# Patient Record
Sex: Male | Born: 2000
Health system: Southern US, Community
[De-identification: ages and names within clinical notes are randomized; demographics above are authoritative.]

---
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
## 2020-03-25 ENCOUNTER — Other Ambulatory Visit: Payer: Self-pay

## 2020-03-25 ENCOUNTER — Ambulatory Visit (HOSPITAL_COMMUNITY)
Admission: EM | Admit: 2020-03-25 | Discharge: 2020-03-25 | Disposition: A | Discharge status: Home / Self Care | Payer: Medicaid Other | Attending: Family Medicine | Admitting: Family Medicine

## 2020-03-25 ENCOUNTER — Encounter (HOSPITAL_COMMUNITY): Payer: Self-pay | Admitting: Family Medicine

## 2020-03-25 DIAGNOSIS — S50852A Superficial foreign body of left forearm, initial encounter: Secondary | ICD-10-CM

## 2020-03-25 MED ORDER — CEPHALEXIN 500 MG PO CAPS: 500.0000 mg | ORAL_CAPSULE | Freq: Two times a day (BID) | ORAL | 0 refills | Status: DC

## 2020-03-25 MED ORDER — LIDOCAINE-EPINEPHRINE (PF) 2 %-1:200000 IJ SOLN
INTRAMUSCULAR | Status: AC
  Filled 2020-03-25: qty 20
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
## 2020-03-25 NOTE — Discharge Instructions (Addendum)
Return in 1 week to have suture removed  Wash with soap and water daily starting tomorrow morning.
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
## 2020-04-06 ENCOUNTER — Other Ambulatory Visit: Payer: Self-pay

## 2020-04-06 ENCOUNTER — Ambulatory Visit (HOSPITAL_COMMUNITY)
Admission: EM | Admit: 2020-04-06 | Discharge: 2020-04-06 | Disposition: A | Discharge status: Home / Self Care | Payer: Medicaid Other

## 2020-04-06 DIAGNOSIS — Z4802 Encounter for removal of sutures: Secondary | ICD-10-CM | POA: Diagnosis not present

## 2020-05-27 ENCOUNTER — Encounter (HOSPITAL_COMMUNITY): Payer: Self-pay

## 2020-05-27 ENCOUNTER — Ambulatory Visit (HOSPITAL_COMMUNITY)
Admission: EM | Admit: 2020-05-27 | Discharge: 2020-05-27 | Disposition: A | Discharge status: Home / Self Care | Payer: Medicaid Other | Attending: Internal Medicine | Admitting: Internal Medicine

## 2020-05-27 ENCOUNTER — Other Ambulatory Visit: Payer: Self-pay

## 2020-05-27 DIAGNOSIS — J028 Acute pharyngitis due to other specified organisms: Secondary | ICD-10-CM | POA: Insufficient documentation

## 2020-05-27 DIAGNOSIS — J029 Acute pharyngitis, unspecified: Secondary | ICD-10-CM | POA: Diagnosis not present

## 2020-05-27 DIAGNOSIS — Z20822 Contact with and (suspected) exposure to covid-19: Secondary | ICD-10-CM | POA: Insufficient documentation

## 2020-05-27 DIAGNOSIS — B9789 Other viral agents as the cause of diseases classified elsewhere: Secondary | ICD-10-CM | POA: Diagnosis not present

## 2020-05-27 LAB — SARS CORONAVIRUS 2 (TAT 6-24 HRS): SARS Coronavirus 2: NEGATIVE

## 2020-05-27 NOTE — ED Provider Notes (Signed)
[place]    CSN: [id number] Arrival date & time: 05/27/20  1041      History   Chief Complaint Chief Complaint  Patient presents with  . Sore Throat  . Cough    HPI John Brown is a 19 y.o. male comes to the urgent care with a 3-day history of sore throat, runny nose and a cough productive of yellow sputum.  Patient denies any fever or chills.  No nausea or vomiting.  No sick contacts.  No loss of taste or smell.  No generalized body aches, nausea, vomiting or diarrhea.   HPI  History reviewed. No pertinent past medical history.  Patient Active Problem List   Diagnosis Date Noted  . Lumbar strain, initial encounter 07/28/2019  . Possible exposure to STD 06/25/2019  . Sore throat 06/25/2019  . Allergic contact dermatitis due to metals 07/26/2018  . Eczema 03/19/2012    History reviewed. No pertinent surgical history.     Home Medications    Prior to Admission medications   Not on File    Family History Family History  Problem Relation Age of Onset  . Healthy Mother   . Sickle cell anemia Father     Social History Social History   Tobacco Use  . Smoking status: Never Smoker  . Smokeless tobacco: Never Used  Substance Use Topics  . Alcohol use: No  . Drug use: Not Currently    Types: Marijuana     Allergies   Patient has no known allergies.   Review of Systems Review of Systems  Constitutional: Negative for activity change, chills, fatigue and fever.  Respiratory: Negative for cough, shortness of breath and wheezing.   Cardiovascular: Negative for chest pain.  Gastrointestinal: Negative.   Musculoskeletal: Negative.      Physical Exam Triage Vital Signs ED Triage Vitals [05/27/20 1104]  Enc Vitals Group     BP 128/79     Pulse Rate 67     Resp 19     Temp 98.5 F (36.9 C)     Temp Source Oral     SpO2 100 %     Weight      Height      Head Circumference      Peak Flow      Pain Score 0     Pain Loc      Pain  Edu?      Excl. in [place]?    No data found.  Updated Vital Signs BP 128/79 (BP Location: Left Arm)   Pulse 67   Temp 98.5 F (36.9 C) (Oral)   Resp 19   SpO2 100%   Visual Acuity Right Eye Distance:   Left Eye Distance:   Bilateral Distance:    Right Eye Near:   Left Eye Near:    Bilateral Near:     Physical Exam Vitals and nursing note reviewed.  Constitutional:      General: He is not in acute distress.    Appearance: He is not ill-appearing.  HENT:     Mouth/Throat:     Tonsils: No tonsillar exudate or tonsillar abscesses. 0 on the right. 0 on the left.  Cardiovascular:     Rate and Rhythm: Normal rate and regular rhythm.     Heart sounds: Normal heart sounds. No murmur. No friction rub.  Pulmonary:     Effort: Pulmonary effort is normal.     Breath sounds: Normal breath sounds.  Abdominal:  General: Bowel sounds are normal.     Palpations: Abdomen is soft.  Skin:    Capillary Refill: Capillary refill takes less than 2 seconds.  Neurological:     Mental Status: He is alert.      UC Treatments / Results  Labs (all labs ordered are listed, but only abnormal results are displayed) Labs Reviewed  SARS CORONAVIRUS 2 (TAT 6-24 HRS)    EKG   Radiology No results found.  Procedures Procedures (including critical care time)  Medications Ordered in UC Medications - No data to display  Initial Impression / Assessment and Plan / UC Course  I have reviewed the triage vital signs and the nursing notes.  Pertinent labs & imaging results that were available during my care of the patient were reviewed by me and considered in my medical decision making (see chart for details).     1.  Viral pharyngitis: Covid PCR test sent Patient is advised to quarantine until COVID-19 test results are available Warm salt water gargle Tylenol Motrin for pain Return precautions given. Final Clinical Impressions(s) / UC Diagnoses   Final diagnoses:  Viral pharyngitis     Discharge Instructions   None    ED Prescriptions    None     PDMP not reviewed this encounter.   Merrilee Jansky, MD 05/27/20 [phone]

## 2020-05-27 NOTE — ED Triage Notes (Signed)
Pt c/o acute onset productive cough with yellow sputum, sore throat, fatigue for approx 3 days.   Has been using nighttime OTC cough/cold with some improvement.   Denies SOB, fever, chills, HA, abdom pain, n/v/d.

## 2020-08-05 ENCOUNTER — Ambulatory Visit: Payer: Medicaid Other | Admitting: Family Medicine

## 2020-08-08 ENCOUNTER — Other Ambulatory Visit: Payer: Self-pay

## 2020-08-08 ENCOUNTER — Ambulatory Visit (HOSPITAL_COMMUNITY)
Admission: EM | Admit: 2020-08-08 | Discharge: 2020-08-08 | Disposition: A | Discharge status: Home / Self Care | Payer: Medicaid Other | Attending: Urgent Care | Admitting: Urgent Care

## 2020-08-08 ENCOUNTER — Encounter (HOSPITAL_COMMUNITY): Payer: Self-pay

## 2020-08-08 DIAGNOSIS — R300 Dysuria: Secondary | ICD-10-CM | POA: Diagnosis not present

## 2020-08-08 DIAGNOSIS — N342 Other urethritis: Secondary | ICD-10-CM | POA: Insufficient documentation

## 2020-08-08 LAB — POCT URINALYSIS DIPSTICK, ED / UC
Bilirubin Urine: NEGATIVE
Glucose, UA: NEGATIVE mg/dL
Hgb urine dipstick: NEGATIVE
Ketones, ur: NEGATIVE mg/dL
Nitrite: NEGATIVE
Protein, ur: 30 mg/dL — AB
Specific Gravity, Urine: 1.02 (ref 1.005–1.030)
Urobilinogen, UA: 0.2 mg/dL (ref 0.0–1.0)
pH: >=9 (ref 5.0–8.0)

## 2020-08-08 MED ORDER — DOXYCYCLINE HYCLATE 100 MG PO CAPS: 100.0000 mg | ORAL_CAPSULE | Freq: Two times a day (BID) | ORAL | 0 refills | Status: AC

## 2020-08-08 MED ORDER — LIDOCAINE HCL (PF) 1 % IJ SOLN
INTRAMUSCULAR | Status: AC
  Filled 2020-08-08: qty 2

## 2020-08-08 MED ORDER — CEFTRIAXONE SODIUM 500 MG IJ SOLR
500.0000 mg | Freq: Once | INTRAMUSCULAR | Status: AC
  Administered 2020-08-08: 500 mg via INTRAMUSCULAR

## 2020-08-08 MED ORDER — CEFTRIAXONE SODIUM 500 MG IJ SOLR
INTRAMUSCULAR | Status: AC
  Filled 2020-08-08: qty 500

## 2020-08-08 NOTE — Discharge Instructions (Signed)
Take the medication as prescribed  We have sent your tests and will call if we need to change treatments  Abstain from sex until all treatments and tests return  If no improvement after treatments and all tests return, return or follow up with your primary care

## 2020-08-08 NOTE — ED Provider Notes (Signed)
[facility]    CSN: [id number] Arrival date & time: 08/08/20  1309      History   Chief Complaint Chief Complaint  Patient presents with  . Dysuria    HPI John Brown is a 19 y.o. male.   Patient presents for painful urination for the last 4 days.  He has had some discharge.  Denies testicular pain or swelling.  States he has not been sexually active in a year.  Has never had symptoms like this before.  Denies frequent urination or urgency.  Denies abdominal pain.  Denies fever and chills.     History reviewed. No pertinent past medical history.  Patient Active Problem List   Diagnosis Date Noted  . Lumbar strain, initial encounter 07/28/2019  . Possible exposure to STD 06/25/2019  . Sore throat 06/25/2019  . Allergic contact dermatitis due to metals 07/26/2018  . Eczema 03/19/2012    History reviewed. No pertinent surgical history.     Home Medications    Prior to Admission medications   Medication Sig Start Date End Date Taking? Authorizing Provider  doxycycline (VIBRAMYCIN) 100 MG capsule Take 1 capsule (100 mg total) by mouth 2 (two) times daily for 7 days. 08/08/20 08/15/20  Sundai Probert, Veryl Speak, PA-C    Family History Family History  Problem Relation Age of Onset  . Healthy Mother   . Sickle cell anemia Father     Social History Social History   Tobacco Use  . Smoking status: Never Smoker  . Smokeless tobacco: Never Used  Substance Use Topics  . Alcohol use: No  . Drug use: Not Currently    Types: Marijuana     Allergies   Patient has no known allergies.   Review of Systems Review of Systems   Physical Exam Triage Vital Signs ED Triage Vitals  Enc Vitals Group     BP 08/08/20 1343 120/68     Pulse Rate 08/08/20 1343 (!) 52     Resp 08/08/20 1343 16     Temp 08/08/20 1343 98.1 F (36.7 C)     Temp Source 08/08/20 1343 Oral     SpO2 08/08/20 1343 100 %     Weight 08/08/20 1345 145 lb (65.8 kg)     Height 08/08/20 1345 6'  4" (1.93 m)     Head Circumference --      Peak Flow --      Pain Score 08/08/20 1345 7     Pain Loc --      Pain Edu? --      Excl. in [place]? --    No data found.  Updated Vital Signs BP 120/68   Pulse (!) 52   Temp 98.1 F (36.7 C) (Oral)   Resp 16   Ht 6\' 4"  (1.93 m)   Wt 145 lb (65.8 kg)   SpO2 100%   BMI 17.65 kg/m   Visual Acuity Right Eye Distance:   Left Eye Distance:   Bilateral Distance:    Right Eye Near:   Left Eye Near:    Bilateral Near:     Physical Exam Vitals and nursing note reviewed.  Constitutional:      Appearance: Normal appearance.  Cardiovascular:     Rate and Rhythm: Normal rate.  Pulmonary:     Effort: Pulmonary effort is normal. No respiratory distress.  Genitourinary:    Comments: Bilateral inguinal lymphadenopathy  There is yellow/green discharge at the meatus.  No testicular swelling or tenderness.  UC Treatments / Results  Labs (all labs ordered are listed, but only abnormal results are displayed) Labs Reviewed  POCT URINALYSIS DIPSTICK, ED / UC - Abnormal; Notable for the following components:      Result Value   Protein, ur 30 (*)    Leukocytes,Ua MODERATE (*)    All other components within normal limits  URINE CULTURE  CYTOLOGY, (ORAL, ANAL, URETHRAL) ANCILLARY ONLY    EKG   Radiology No results found.  Procedures Procedures (including critical care time)  Medications Ordered in UC Medications  cefTRIAXone (ROCEPHIN) injection 500 mg (500 mg Intramuscular Given 08/08/20 1457)    Initial Impression / Assessment and Plan / UC Course  I have reviewed the triage vital signs and the nursing notes.  Pertinent labs & imaging results that were available during my care of the patient were reviewed by me and considered in my medical decision making (see chart for details).     #Urethritis Patient is a 19 year old presenting with urethritis.  Rocephin given in clinic..  Will discharge with doxycycline.  Cytology  swab sent.  Will culture urine as well.  Discussed follow-up and return precautions.  Patient verbalized understanding plan of care. Final Clinical Impressions(s) / UC Diagnoses   Final diagnoses:  Urethritis     Discharge Instructions     Take the medication as prescribed  We have sent your tests and will call if we need to change treatments  Abstain from sex until all treatments and tests return  If no improvement after treatments and all tests return, return or follow up with your primary care      ED Prescriptions    Medication Sig Dispense Auth. Provider   doxycycline (VIBRAMYCIN) 100 MG capsule Take 1 capsule (100 mg total) by mouth 2 (two) times daily for 7 days. 14 capsule Liban Guedes, Veryl Speak, PA-C     PDMP not reviewed this encounter.   Hermelinda Medicus, PA-C 08/08/20 1915

## 2020-08-08 NOTE — ED Triage Notes (Signed)
Pt c/o dysuriax4 days. Pt denies any other symptoms.

## 2020-08-09 LAB — URINE CULTURE: Culture: NO GROWTH

## 2020-08-09 LAB — CYTOLOGY, (ORAL, ANAL, URETHRAL) ANCILLARY ONLY
Chlamydia: NEGATIVE
Comment: NEGATIVE
Comment: NEGATIVE
Comment: NORMAL
Neisseria Gonorrhea: POSITIVE — AB
Trichomonas: NEGATIVE

## 2020-08-17 ENCOUNTER — Telehealth: Payer: Self-pay | Admitting: Family Medicine

## 2020-08-17 NOTE — Telephone Encounter (Signed)
Patient had green discharge and burning pain with urination so he was seen at urgent care on 8/15. He was treated with doxycycline for presumed urethritis. Results were positive for gonorrhea. UC attempted to contact patient, but were unable to do so. Today I called patient and spoke with him on the phone. He noted that he has had some dysuria, but discomfort and discharge largely resolved with doxycycline. He has not had unprotected sex since UC visit. He reports he was waiting to hear from them. Discussed how to anonymously inform partners via [email or web address]. Discussed safe sex practices, especially including now as he is not fully treated for gonorrhea. He denies fevers, chills, or other GU problems. Scheduled for [facility] clinic on Thursday, 8/26, at 1:30 PM for CTX IM treatment and full STI work up (HIV, RPR).   Shirlean Mylar, MD [facility], PGY-2

## 2020-08-19 ENCOUNTER — Ambulatory Visit: Payer: Medicaid Other

## 2020-10-29 ENCOUNTER — Ambulatory Visit (HOSPITAL_COMMUNITY)
Admission: EM | Admit: 2020-10-29 | Discharge: 2020-10-29 | Disposition: A | Discharge status: Home / Self Care | Payer: Medicaid Other | Attending: Family Medicine | Admitting: Family Medicine

## 2020-10-29 ENCOUNTER — Encounter (HOSPITAL_COMMUNITY): Payer: Self-pay

## 2020-10-29 ENCOUNTER — Other Ambulatory Visit: Payer: Self-pay

## 2020-10-29 DIAGNOSIS — J029 Acute pharyngitis, unspecified: Secondary | ICD-10-CM | POA: Diagnosis not present

## 2020-10-29 DIAGNOSIS — R531 Weakness: Secondary | ICD-10-CM | POA: Diagnosis not present

## 2020-10-29 DIAGNOSIS — Z1152 Encounter for screening for COVID-19: Secondary | ICD-10-CM

## 2020-10-29 LAB — POCT RAPID STREP A, ED / UC: Streptococcus, Group A Screen (Direct): NEGATIVE

## 2020-10-29 MED ORDER — AMOXICILLIN 500 MG PO CAPS: 1000.0000 mg | ORAL_CAPSULE | Freq: Every day | ORAL | 0 refills | Status: AC

## 2020-10-29 NOTE — Discharge Instructions (Addendum)
Treating you for strep throat Take the medicine as prescribed Ibuprofen for pain as needed.  Warm salt water gargles.  Follow up as needed for continued or worsening symptoms

## 2020-10-29 NOTE — ED Provider Notes (Signed)
[facility]    CSN: [id number] Arrival date & time: 10/29/20  1243      History   Chief Complaint Chief Complaint  Patient presents with  . Sore Throat  . Weakness    HPI John Brown is a 19 y.o. male.   Patient is a 19 year old male presents today for sore throat, weakness.  Is been present worsening over the past week.  Pain with swallowing and mild trouble swallowing.  Low-grade fever today.  No other associated symptoms to include cough, chest congestion, nasal congestion rhinorrhea.     History reviewed. No pertinent past medical history.  Patient Active Problem List   Diagnosis Date Noted  . Lumbar strain, initial encounter 07/28/2019  . Possible exposure to STD 06/25/2019  . Sore throat 06/25/2019  . Allergic contact dermatitis due to metals 07/26/2018  . Eczema 03/19/2012    History reviewed. No pertinent surgical history.     Home Medications    Prior to Admission medications   Medication Sig Start Date End Date Taking? Authorizing Provider  amoxicillin (AMOXIL) 500 MG capsule Take 2 capsules (1,000 mg total) by mouth daily for 10 days. 10/29/20 11/08/20  Janace Aris, NP    Family History Family History  Problem Relation Age of Onset  . Healthy Mother   . Sickle cell anemia Father     Social History Social History   Tobacco Use  . Smoking status: Never Smoker  . Smokeless tobacco: Never Used  Substance Use Topics  . Alcohol use: No  . Drug use: Not Currently    Types: Marijuana     Allergies   Patient has no known allergies.   Review of Systems Review of Systems   Physical Exam Triage Vital Signs ED Triage Vitals  Enc Vitals Group     BP 10/29/20 1439 119/74     Pulse Rate 10/29/20 1439 89     Resp 10/29/20 1439 18     Temp 10/29/20 1439 99.3 F (37.4 C)     Temp Source 10/29/20 1439 Oral     SpO2 10/29/20 1439 100 %     Weight --      Height --      Head Circumference --      Peak Flow --      Pain  Score 10/29/20 1445 7     Pain Loc --      Pain Edu? --      Excl. in GC? --    No data found.  Updated Vital Signs BP 119/74 (BP Location: Right Arm)   Pulse 89   Temp 99.3 F (37.4 C) (Oral)   Resp 18   SpO2 100%   Visual Acuity Right Eye Distance:   Left Eye Distance:   Bilateral Distance:    Right Eye Near:   Left Eye Near:    Bilateral Near:     Physical Exam Vitals and nursing note reviewed.  Constitutional:      Appearance: Normal appearance.  HENT:     Head: Normocephalic and atraumatic.     Nose: Nose normal.     Mouth/Throat:     Pharynx: Uvula midline. Posterior oropharyngeal erythema present. No uvula swelling.     Tonsils: Tonsillar exudate present. No tonsillar abscesses. 2+ on the right. 2+ on the left.  Eyes:     Conjunctiva/sclera: Conjunctivae normal.  Pulmonary:     Effort: Pulmonary effort is normal.  Musculoskeletal:  General: Normal range of motion.     Cervical back: Normal range of motion.  Skin:    General: Skin is warm and dry.  Neurological:     Mental Status: He is alert.  Psychiatric:        Mood and Affect: Mood normal.      UC Treatments / Results  Labs (all labs ordered are listed, but only abnormal results are displayed) Labs Reviewed  SARS CORONAVIRUS 2 (TAT 6-24 HRS)  CULTURE, GROUP A STREP [facility])  POCT RAPID STREP A, ED / UC    EKG   Radiology No results found.  Procedures Procedures (including critical care time)  Medications Ordered in UC Medications - No data to display  Initial Impression / Assessment and Plan / UC Course  I have reviewed the triage vital signs and the nursing notes.  Pertinent labs & imaging results that were available during my care of the patient were reviewed by me and considered in my medical decision making (see chart for details).     Sore throat Rapid strep test negative.  Sending  for culture.  We will go ahead and treat based on patient's history of streptococcal  throat infections and presentation on  exam.  Patient has gotten worse over the past week. Treated with amoxicillin Or Profen or Tylenol for pain as needed.  Warm salt water gargles a few times a day Follow up as needed for continued or worsening symptoms  Final Clinical Impressions(s) / UC Diagnoses   Final diagnoses:  Encounter for screening for COVID-19  Sore throat     Discharge Instructions     Treating you for strep throat Take the medicine as prescribed Ibuprofen for pain as needed.  Warm salt water gargles.  Follow up as needed for continued or worsening symptoms     ED Prescriptions    Medication Sig Dispense Auth. Provider   amoxicillin (AMOXIL) 500 MG capsule Take 2 capsules (1,000 mg total) by mouth daily for 10 days. 20 capsule Dahlia Byes A, NP     PDMP not reviewed this encounter.   Janace Aris, NP 10/29/20 1539

## 2020-10-29 NOTE — ED Triage Notes (Signed)
Pt present sore throat with weakness, pt states symptoms started on 10/26/20. Pt states it hurts to swallow and feels like a knot is in his throat.

## 2020-10-30 LAB — SARS CORONAVIRUS 2 (TAT 6-24 HRS): SARS Coronavirus 2: NEGATIVE

## 2020-10-31 LAB — CULTURE, GROUP A STREP (THRC)

## 2021-03-09 DIAGNOSIS — Z20822 Contact with and (suspected) exposure to covid-19: Secondary | ICD-10-CM | POA: Diagnosis not present

## 2021-08-15 ENCOUNTER — Other Ambulatory Visit: Payer: Self-pay

## 2021-08-15 ENCOUNTER — Encounter (HOSPITAL_COMMUNITY): Payer: Self-pay

## 2021-08-15 ENCOUNTER — Ambulatory Visit (HOSPITAL_COMMUNITY)
Admission: EM | Admit: 2021-08-15 | Discharge: 2021-08-15 | Disposition: A | Discharge status: Home / Self Care | Payer: Medicaid Other | Attending: Family Medicine | Admitting: Family Medicine

## 2021-08-15 DIAGNOSIS — S161XXA Strain of muscle, fascia and tendon at neck level, initial encounter: Secondary | ICD-10-CM

## 2021-08-15 DIAGNOSIS — S39012A Strain of muscle, fascia and tendon of lower back, initial encounter: Secondary | ICD-10-CM

## 2021-08-15 MED ORDER — NAPROXEN 500 MG PO TABS: 500.0000 mg | ORAL_TABLET | Freq: Two times a day (BID) | ORAL | 0 refills | Status: DC | PRN

## 2021-08-15 MED ORDER — CYCLOBENZAPRINE HCL 5 MG PO TABS: 5.0000 mg | ORAL_TABLET | Freq: Every evening | ORAL | 0 refills | Status: DC | PRN

## 2021-08-15 NOTE — ED Provider Notes (Signed)
[facility]    CSN: [id number] Arrival date & time: 08/15/21  0907      History   Chief Complaint Chief Complaint  Patient presents with   Back Pain    HPI John Brown is a 20 y.o. male.   Patient presenting today with 1 month history of neck and low back soreness with certain movements, particularly heavy lifting.  States it started while he was at his old job and lifted a heavy box.  States the pain was significant initially and brought him to his knees but eased off after time.  As long as he does not lift anything heavy he is usually doing okay but the pain returned today at a 2 out of 10.  He denies any radiation down arms or legs, numbness, tingling, bowel or bladder incontinence, decreased range of motion.  Took some ibuprofen at 1 point which did help the pain.   History reviewed. No pertinent past medical history.  Patient Active Problem List   Diagnosis Date Noted   Lumbar strain, initial encounter 07/28/2019   Possible exposure to STD 06/25/2019   Sore throat 06/25/2019   Allergic contact dermatitis due to metals 07/26/2018   Eczema 03/19/2012    History reviewed. No pertinent surgical history.     Home Medications    Prior to Admission medications   Medication Sig Start Date End Date Taking? Authorizing Provider  cyclobenzaprine (FLEXERIL) 5 MG tablet Take 1 tablet (5 mg total) by mouth at bedtime as needed for muscle spasms. Do not drink alcohol or drive while taking this medication.  May cause drowsiness. 08/15/21  Yes Particia Nearing, PA-C  naproxen (NAPROSYN) 500 MG tablet Take 1 tablet (500 mg total) by mouth 2 (two) times daily as needed. 08/15/21  Yes Particia Nearing, PA-C    Family History Family History  Problem Relation Age of Onset   Healthy Mother    Sickle cell anemia Father     Social History Social History   Tobacco Use   Smoking status: Never   Smokeless tobacco: Never  Vaping Use   Vaping Use: Never used   Substance Use Topics   Alcohol use: No   Drug use: Not Currently    Types: Marijuana     Allergies   Patient has no known allergies.   Review of Systems Review of Systems Per HPI  Physical Exam Triage Vital Signs ED Triage Vitals [08/15/21 1022]  Enc Vitals Group     BP (!) 135/59     Pulse Rate 81     Resp 18     Temp 98.3 F (36.8 C)     Temp Source Oral     SpO2 100 %     Weight      Height      Head Circumference      Peak Flow      Pain Score 2     Pain Loc      Pain Edu?      Excl. in GC?    No data found.  Updated Vital Signs BP (!) 135/59 (BP Location: Left Arm)   Pulse 81   Temp 98.3 F (36.8 C) (Oral)   Resp 18   SpO2 100%   Visual Acuity Right Eye Distance:   Left Eye Distance:   Bilateral Distance:    Right Eye Near:   Left Eye Near:    Bilateral Near:     Physical Exam Vitals and nursing note  reviewed.  Constitutional:      Appearance: Normal appearance.  HENT:     Head: Atraumatic.     Mouth/Throat:     Mouth: Mucous membranes are moist.  Eyes:     Extraocular Movements: Extraocular movements intact.     Conjunctiva/sclera: Conjunctivae normal.  Cardiovascular:     Rate and Rhythm: Normal rate and regular rhythm.  Pulmonary:     Effort: Pulmonary effort is normal.     Breath sounds: Normal breath sounds.  Musculoskeletal:        General: Tenderness present. No swelling or deformity. Normal range of motion.     Cervical back: Normal range of motion and neck supple.     Comments: No midline spinal tenderness palpation diffusely.  Right cervical paraspinal muscles tender to palpation mild spasm.  Range of motion full and intact, normal gait, normal strength in all 4 extremities  Skin:    General: Skin is warm and dry.  Neurological:     General: No focal deficit present.     Mental Status: He is oriented to person, place, and time.     Motor: No weakness.     Gait: Gait normal.     Comments: All 4 extremities neurovascular  intact  Psychiatric:        Mood and Affect: Mood normal.        Thought Content: Thought content normal.        Judgment: Judgment normal.     UC Treatments / Results  Labs (all labs ordered are listed, but only abnormal results are displayed) Labs Reviewed - No data to display  EKG   Radiology No results found.  Procedures Procedures (including critical care time)  Medications Ordered in UC Medications - No data to display  Initial Impression / Assessment and Plan / UC Course  I have reviewed the triage vital signs and the nursing notes.  Pertinent labs & imaging results that were available during my care of the patient were reviewed by me and considered in my medical decision making (see chart for details).     Suspect muscular strain causing his symptoms.  We will treat with naproxen, Flexeril and stretches, rest.  Work note given.  Return for acutely worsening symptoms.  Final Clinical Impressions(s) / UC Diagnoses   Final diagnoses:  Strain of lumbar region, initial encounter  Strain of neck muscle, initial encounter   Discharge Instructions   None    ED Prescriptions     Medication Sig Dispense Auth. Provider   naproxen (NAPROSYN) 500 MG tablet Take 1 tablet (500 mg total) by mouth 2 (two) times daily as needed. 20 tablet Particia Nearing, [place]   cyclobenzaprine (FLEXERIL) 5 MG tablet Take 1 tablet (5 mg total) by mouth at bedtime as needed for muscle spasms. Do not drink alcohol or drive while taking this medication.  May cause drowsiness. 5 tablet Particia Nearing, [place]      PDMP not reviewed this encounter.   Particia Nearing, [place] 08/15/21 1118

## 2021-08-15 NOTE — ED Triage Notes (Signed)
Pt in with c/o lower back pain that radiates to his neck that has been going on for 1 month  Pt states he was at work lifting a box when the pain started

## 2021-09-23 DIAGNOSIS — Z20822 Contact with and (suspected) exposure to covid-19: Secondary | ICD-10-CM | POA: Diagnosis not present

## 2021-11-23 ENCOUNTER — Other Ambulatory Visit: Payer: Self-pay

## 2021-11-23 ENCOUNTER — Encounter (HOSPITAL_COMMUNITY): Payer: Self-pay | Admitting: Emergency Medicine

## 2021-11-23 ENCOUNTER — Ambulatory Visit (HOSPITAL_COMMUNITY)
Admission: EM | Admit: 2021-11-23 | Discharge: 2021-11-23 | Disposition: A | Discharge status: Home / Self Care | Payer: Medicaid Other | Attending: Family Medicine | Admitting: Family Medicine

## 2021-11-23 DIAGNOSIS — J101 Influenza due to other identified influenza virus with other respiratory manifestations: Secondary | ICD-10-CM

## 2021-11-23 LAB — POC INFLUENZA A AND B ANTIGEN (URGENT CARE ONLY)
INFLUENZA A ANTIGEN, POC: POSITIVE — AB
INFLUENZA B ANTIGEN, POC: NEGATIVE

## 2021-11-23 MED ORDER — ACETAMINOPHEN 325 MG PO TABS
ORAL_TABLET | ORAL | Status: AC
  Filled 2021-11-23: qty 2

## 2021-11-23 MED ORDER — OSELTAMIVIR PHOSPHATE 75 MG PO CAPS: 75.0000 mg | ORAL_CAPSULE | Freq: Two times a day (BID) | ORAL | 0 refills | Status: DC

## 2021-11-23 MED ORDER — ACETAMINOPHEN 325 MG PO TABS
650.0000 mg | ORAL_TABLET | Freq: Once | ORAL | Status: AC
  Administered 2021-11-23: 650 mg via ORAL

## 2021-11-23 NOTE — ED Provider Notes (Signed)
[facility]    CSN: [id number] Arrival date & time: 11/23/21  1225      History   Chief Complaint Chief Complaint  Patient presents with   Cough   Generalized Body Aches    HPI John Brown is a 20 y.o. male.   HPI Patient presents today with 3 days of generalized body aches, fever, cough, fatigue.  On arrival he is a fever of 102.3.  He has not taken any medications for fever.  Unknown of any known exposure to anyone positive for flu or COVID. Endorses a poor appetite and has not eaten over the last couple days with food taste different.  He has been able to tolerate fluids.  Denies any difficulty breathing, wheezing or history of asthma.  History reviewed. No pertinent past medical history.  Patient Active Problem List   Diagnosis Date Noted   Lumbar strain, initial encounter 07/28/2019   Possible exposure to STD 06/25/2019   Sore throat 06/25/2019   Allergic contact dermatitis due to metals 07/26/2018   Eczema 03/19/2012    History reviewed. No pertinent surgical history.     Home Medications    Prior to Admission medications   Medication Sig Start Date End Date Taking? Authorizing Provider  oseltamivir (TAMIFLU) 75 MG capsule Take 1 capsule (75 mg total) by mouth 2 (two) times daily. 11/23/21  Yes Bing Neighbors, FNP  cyclobenzaprine (FLEXERIL) 5 MG tablet Take 1 tablet (5 mg total) by mouth at bedtime as needed for muscle spasms. Do not drink alcohol or drive while taking this medication.  May cause drowsiness. 08/15/21   Particia Nearing, PA-C  naproxen (NAPROSYN) 500 MG tablet Take 1 tablet (500 mg total) by mouth 2 (two) times daily as needed. 08/15/21   Particia Nearing, PA-C    Family History Family History  Problem Relation Age of Onset   Healthy Mother    Sickle cell anemia Father     Social History Social History   Tobacco Use   Smoking status: Never   Smokeless tobacco: Never  Vaping Use   Vaping Use: Never used   Substance Use Topics   Alcohol use: No   Drug use: Not Currently    Types: Marijuana     Allergies   Patient has no known allergies.   Review of Systems Review of Systems Pertinent negatives listed in HPI  Physical Exam Triage Vital Signs ED Triage Vitals  Enc Vitals Group     BP 11/23/21 1447 119/78     Pulse Rate 11/23/21 1447 (!) 113     Resp 11/23/21 1447 15     Temp 11/23/21 1447 (!) 102.3 F (39.1 C)     Temp Source 11/23/21 1447 Oral     SpO2 11/23/21 1447 96 %     Weight --      Height --      Head Circumference --      Peak Flow --      Pain Score 11/23/21 1446 10     Pain Loc --      Pain Edu? --      Excl. in GC? --    No data found.  Updated Vital Signs BP 119/78 (BP Location: Right Arm)   Pulse (!) 113   Temp (!) 102.3 F (39.1 C) (Oral)   Resp 15   SpO2 96%   Visual Acuity Right Eye Distance:   Left Eye Distance:   Bilateral Distance:    Right  Eye Near:   Left Eye Near:    Bilateral Near:     Physical Exam General appearance: Alert, acutely ill-appearing, cooperative, no distress Head: Normocephalic, without obvious abnormality, atraumatic Respiratory: Respirations even and unlabored, normal respiratory rate Heart: Tachycardia, rhythm normal. Skin: Skin warm with normal color, texture, turgor normal. No rashes seen  Psych: Appropriate mood and affect. Neurologic: No obvious focal abnormalities present on exam UC Treatments / Results  Labs (all labs ordered are listed, but only abnormal results are displayed) Labs Reviewed  POC INFLUENZA A AND B ANTIGEN (URGENT CARE ONLY) - Abnormal; Notable for the following components:      Result Value   INFLUENZA A ANTIGEN, POC POSITIVE (*)    All other components within normal limits    EKG   Radiology No results found.  Procedures Procedures (including critical care time)  Medications Ordered in UC Medications  acetaminophen (TYLENOL) tablet 650 mg (650 mg Oral Given 11/23/21 1515)     Initial Impression / Assessment and Plan / UC Course  I have reviewed the triage vital signs and the nursing notes.  Pertinent labs & imaging results that were available during my care of the patient were reviewed by me and considered in my medical decision making (see chart for details).    Influenza A positive start Tamiflu 75 mg twice daily for 5 days.  Alternate Tylenol and ibuprofen as needed for fever and body aches.  Hydrate well with fluids.  Remain from returning to work until Monday, 11/28/2021.  Return as needed. Final Clinical Impressions(s) / UC Diagnoses   Final diagnoses:  Influenza A     Discharge Instructions      He tested positive today for influenza A. Start Tamiflu 75 mg twice daily for 5 days to shorten the course and severity of symptoms related to influenza.  Alternate Tylenol and ibuprofen as needed for fever. Drink plenty of fluids and eat and tolerated.     ED Prescriptions     Medication Sig Dispense Auth. Provider   oseltamivir (TAMIFLU) 75 MG capsule Take 1 capsule (75 mg total) by mouth 2 (two) times daily. 10 capsule Bing Neighbors, FNP      PDMP not reviewed this encounter.   Bing Neighbors, FNP 11/23/21 [phone]

## 2021-11-23 NOTE — ED Triage Notes (Signed)
Since Monday having cough, congestion, body aches, headache.

## 2021-11-23 NOTE — Discharge Instructions (Addendum)
He tested positive today for influenza A. Start Tamiflu 75 mg twice daily for 5 days to shorten the course and severity of symptoms related to influenza.  Alternate Tylenol and ibuprofen as needed for fever. Drink plenty of fluids and eat and tolerated.

## 2021-12-08 ENCOUNTER — Ambulatory Visit (INDEPENDENT_AMBULATORY_CARE_PROVIDER_SITE_OTHER): Payer: Medicaid Other | Admitting: Family Medicine

## 2021-12-08 ENCOUNTER — Encounter: Payer: Self-pay | Admitting: Family Medicine

## 2021-12-08 ENCOUNTER — Other Ambulatory Visit: Payer: Self-pay

## 2021-12-08 VITALS — BP 108/62 | HR 86 | Ht 76.0 in | Wt 141.4 lb

## 2021-12-08 DIAGNOSIS — M545 Low back pain, unspecified: Secondary | ICD-10-CM | POA: Diagnosis not present

## 2021-12-08 MED ORDER — DICLOFENAC SODIUM 1 % EX GEL: 2.0000 g | Freq: Four times a day (QID) | CUTANEOUS | 1 refills | Status: DC

## 2021-12-08 MED ORDER — BACLOFEN 10 MG PO TABS: 10.0000 mg | ORAL_TABLET | Freq: Two times a day (BID) | ORAL | 0 refills | Status: DC

## 2021-12-08 NOTE — Assessment & Plan Note (Signed)
-  reassuringly no red flag symptoms, etiology unclear  -ordered lumbar imaging given chronic nature of pain -volatren gel ordered to apply to affected area -baclofen prescribed as appropriate for pain relief -encouraged heating pad, tylenol and NSAIDs as appropriate -PT referral placed  -follow up in 1 month

## 2021-12-08 NOTE — Progress Notes (Signed)
° ° ° °  SUBJECTIVE:   CHIEF COMPLAINT / HPI:   Patient presents for ongoing back pain, he is accompanied by mother. Started when he was about 12 years ago, dsecribes midline lower back pain. Sharp, intermittent pain. Worsens if he stands for long periods of time. Relieivng factors include resting his back. Has tried heating pads, flexeril, ibuprofen, tylenol and naproxen. 2 weeks ago had a fall that worsened the pain. He was at work and as he stood up out of no where he got a sharp, stabbing pain which caused him to fall to the floor. He had to sit in the break room for about 5 minutes before it resolved and he could return back to work. Due to this pain has gotten worse. Denies any numbness or tingling. Pain does not wake him up at night, denies any personal or family history of cancer. Denies groin paresthesia. Pain does not radiate elsewhere. Pain interferes with his job even after resting. Denies any improvement. With palpation rates pain as 6/10, without rates pain 0/10.   OBJECTIVE:   BP 108/62    Pulse 86    Ht 6\' 4"  (1.93 m)    Wt 141 lb 6 oz (64.1 kg)    SpO2 100%    BMI 17.21 kg/m   General: Patient well-appearing, in no acute distress. CV: RRR, no murmurs or gallops auscultated Resp: CTAB MSK: point tenderness along deep palpation of L3-L5 midline along spine, no paraspinal tenderness, ropey and tight changes noted upon palpation, no erythema or edema noted, no other skin changes noted, negative straight leg raise bilaterally  Derm: no rashes or lesions noted Neuro: normal gait, 5/5 LE strength bilaterally  Psych: mood appropriate   ASSESSMENT/PLAN:   Lumbar pain -reassuringly no red flag symptoms, etiology unclear  -ordered lumbar imaging given chronic nature of pain -volatren gel ordered to apply to affected area -baclofen prescribed as appropriate for pain relief -encouraged heating pad, tylenol and NSAIDs as appropriate -PT referral placed  -follow up in 1 month       Trishelle Devora , DO [facility]

## 2021-12-08 NOTE — Patient Instructions (Addendum)
It was great seeing you today!  Today we discussed your back pain. I have ordered imaging of your back, please get this at your earliest convenience. The address is [address], [address], [place], [place].   I have prescribed baclofen, you can take this daily for the pain. You may also apply voltaren gel to the affected area. Using a heating pad, tylenol and ibuprofen as needed can help as well.   I have placed a referral to physical therapy, if you have not heard from them within 1-2 weeks then please contact our office for assistance with scheduling.   Please follow up at your next scheduled appointment in 1 month, if anything arises between now and then, please don't hesitate to contact our office.   Thank you for allowing [place] to be a part of your medical care!  Thank you, Dr. Robyne Peers

## 2022-03-08 ENCOUNTER — Ambulatory Visit (HOSPITAL_COMMUNITY)
Admission: EM | Admit: 2022-03-08 | Discharge: 2022-03-08 | Disposition: A | Discharge status: Home / Self Care | Payer: Medicaid Other | Attending: Internal Medicine | Admitting: Internal Medicine

## 2022-03-08 ENCOUNTER — Other Ambulatory Visit: Payer: Self-pay

## 2022-03-08 DIAGNOSIS — J029 Acute pharyngitis, unspecified: Secondary | ICD-10-CM | POA: Insufficient documentation

## 2022-03-08 DIAGNOSIS — R300 Dysuria: Secondary | ICD-10-CM | POA: Insufficient documentation

## 2022-03-08 DIAGNOSIS — Z113 Encounter for screening for infections with a predominantly sexual mode of transmission: Secondary | ICD-10-CM | POA: Insufficient documentation

## 2022-03-08 LAB — RPR: RPR Ser Ql: NONREACTIVE

## 2022-03-08 LAB — POCT URINALYSIS DIPSTICK, ED / UC
Bilirubin Urine: NEGATIVE
Glucose, UA: NEGATIVE mg/dL
Hgb urine dipstick: NEGATIVE
Ketones, ur: NEGATIVE mg/dL
Leukocytes,Ua: NEGATIVE
Nitrite: NEGATIVE
Protein, ur: NEGATIVE mg/dL
Specific Gravity, Urine: 1.025 (ref 1.005–1.030)
Urobilinogen, UA: 2 mg/dL — ABNORMAL HIGH (ref 0.0–1.0)
pH: 7 (ref 5.0–8.0)

## 2022-03-08 LAB — POCT RAPID STREP A, ED / UC: Streptococcus, Group A Screen (Direct): NEGATIVE

## 2022-03-08 LAB — HIV ANTIBODY (ROUTINE TESTING W REFLEX): HIV Screen 4th Generation wRfx: NONREACTIVE

## 2022-03-08 NOTE — ED Provider Notes (Signed)
?[facility] ? ? ? ?CSN: [id number] ?Arrival date & time: 03/08/22  0930 ? ? ?  ? ?History   ?Chief Complaint ?Chief Complaint  ?Patient presents with  ? Dysuria  ? Sore Throat  ? ? ?HPI ?John Brown is a 21 y.o. male.  ? ?Patient reports 2 day history of dysuria, sore throat.  Denies abdominal pain, back pain, fevers, body aches, congestion.  He reports he coughed up a hard piece of mucus the other day.  He also denies urinary urgency, frequency, hematuria, change in urine odor, nausea and vomiting.   ? ?Has not been sexually active in 2 months.  He is requesting STI testing today.   ? ? ?No past medical history on file. ? ?Patient Active Problem List  ? Diagnosis Date Noted  ? Lumbar pain 12/08/2021  ? Lumbar strain, initial encounter 07/28/2019  ? Possible exposure to STD 06/25/2019  ? Sore throat 06/25/2019  ? Allergic contact dermatitis due to metals 07/26/2018  ? Eczema 03/19/2012  ? ? ?No past surgical history on file. ? ? ? ? ?Home Medications   ? ?Prior to Admission medications   ?Not on File  ? ? ?Family History ?Family History  ?Problem Relation Age of Onset  ? Healthy Mother   ? Sickle cell anemia Father   ? ? ?Social History ?Social History  ? ?Tobacco Use  ? Smoking status: Never  ? Smokeless tobacco: Never  ?Vaping Use  ? Vaping Use: Never used  ?Substance Use Topics  ? Alcohol use: No  ? Drug use: Not Currently  ?  Types: Marijuana  ? ? ? ?Allergies   ?Patient has no known allergies. ? ? ?Review of Systems ?Review of Systems ?Per HPI ? ?Physical Exam ?Triage Vital Signs ?ED Triage Vitals  ?Enc Vitals Group  ?   BP 03/08/22 1050 122/79  ?   Pulse Rate 03/08/22 1050 76  ?   Resp 03/08/22 1050 17  ?   Temp 03/08/22 1050 98.3 ?F (36.8 ?C)  ?   Temp src --   ?   SpO2 03/08/22 1050 98 %  ?   Weight --   ?   Height --   ?   Head Circumference --   ?   Peak Flow --   ?   Pain Score 03/08/22 1048 8  ?   Pain Loc --   ?   Pain Edu? --   ?   Excl. in GC? --   ? ?No data found. ? ?Updated Vital  Signs ?BP 122/79 (BP Location: Right Arm)   Pulse 76   Temp 98.3 ?F (36.8 ?C)   Resp 17   SpO2 98%  ? ?Visual Acuity ?Right Eye Distance:   ?Left Eye Distance:   ?Bilateral Distance:   ? ?Right Eye Near:   ?Left Eye Near:    ?Bilateral Near:    ? ?Physical Exam ?Vitals and nursing note reviewed. Exam conducted with a chaperone present Eliezer Lofts, [employer] fundraiser).  ?Constitutional:   ?   General: He is not in acute distress. ?   Appearance: He is well-developed. He is not toxic-appearing.  ?HENT:  ?   Head: Normocephalic and atraumatic.  ?   Right Ear: Tympanic membrane and ear canal normal. No drainage, swelling or tenderness. No middle ear effusion. Tympanic membrane is not erythematous.  ?   Left Ear: Tympanic membrane and ear canal normal. No drainage, swelling or tenderness.  No middle ear effusion. Tympanic membrane  is not erythematous.  ?   Nose: No congestion or rhinorrhea.  ?   Mouth/Throat:  ?   Mouth: Mucous membranes are moist.  ?   Pharynx: Uvula midline. Posterior oropharyngeal erythema present. No pharyngeal swelling.  ?   Tonsils: Tonsillar exudate present. No tonsillar abscesses. 1+ on the right. 1+ on the left.  ?Eyes:  ?   Extraocular Movements:  ?   Right eye: Normal extraocular motion.  ?   Left eye: Normal extraocular motion.  ?   Pupils: Pupils are equal, round, and reactive to light.  ?Genitourinary: ?   Pubic Area: No rash.   ?   Penis: Normal and circumcised. No tenderness, swelling or lesions.   ?   Testes: Normal.     ?   Right: Mass, tenderness or swelling not present.     ?   Left: Mass, tenderness or swelling not present.  ?   Epididymis:  ?   Right: Normal.  ?   Left: Normal.  ?Musculoskeletal:  ?   Cervical back: Normal range of motion and neck supple.  ?Lymphadenopathy:  ?   Cervical: Cervical adenopathy present.  ?   Lower Body: No right inguinal adenopathy. No left inguinal adenopathy.  ?Skin: ?   General: Skin is warm and dry.  ?   Capillary Refill: Capillary refill takes less than 2  seconds.  ?   Coloration: Skin is not pale.  ?   Findings: No erythema or rash.  ?Neurological:  ?   Mental Status: He is alert and oriented to person, place, and time.  ? ? ? ?UC Treatments / Results  ?Labs ?(all labs ordered are listed, but only abnormal results are displayed) ?Labs Reviewed  ?POCT URINALYSIS DIPSTICK, ED / UC - Abnormal; Notable for the following components:  ?    Result Value  ? Urobilinogen, UA 2.0 (*)   ? All other components within normal limits  ?CULTURE, GROUP A STREP [facility]  ?HIV ANTIBODY (ROUTINE TESTING W REFLEX)  ?RPR  ?POCT RAPID STREP A, ED / UC  ?CYTOLOGY, (ORAL, ANAL, URETHRAL) ANCILLARY ONLY  ? ? ?EKG ? ? ?Radiology ?No results found. ? ?Procedures ?Procedures (including critical care time) ? ?Medications Ordered in UC ?Medications - No data to display ? ?Initial Impression / Assessment and Plan / UC Course  ?I have reviewed the triage vital signs and the nursing notes. ? ?Pertinent labs & imaging results that were available during my care of the patient were reviewed by me and considered in my medical decision making (see chart for details). ? ?  ?Will screen for STI today with urethral cytology. Will notify with positive results and treat as indicated.  UA today unremarkable for acute infection.   ? ?Rapid strep test today is negative.  Will send for throat culture and treat as indicated.  Encouraged warm liquids with lemon, honey in the meantime. ?Final Clinical Impressions(s) / UC Diagnoses  ? ?Final diagnoses:  ?Dysuria  ?Acute pharyngitis, unspecified etiology  ?Routine screening for STI (sexually transmitted infection)  ? ? ? ?Discharge Instructions   ? ?  ?- The urine test today does not show an acute infection. ?- The rapid strep test is negative.  We will call you if the throat culture comes back positive.  You can drink warm liquids with lemon and honey to help with your sore throat. ?- We will call you with any positive results.  ?- Please use condoms with every sexual  encounter. ? ? ? ? ?  ED Prescriptions   ?None ?  ? ?PDMP not reviewed this encounter. ?  ?Valentino Nose, NP ?03/08/22 1144 ? ?

## 2022-03-08 NOTE — Discharge Instructions (Addendum)
-   The urine test today does not show an acute infection. ?- The rapid strep test is negative.  We will call you if the throat culture comes back positive.  You can drink warm liquids with lemon and honey to help with your sore throat. ?- We will call you with any positive results.  ?- Please use condoms with every sexual encounter. ?

## 2022-03-08 NOTE — ED Triage Notes (Signed)
Pt c/o dysuria for a couple days and wants STDs testing. Denies blood in urine or discharge or drainage or exposure to STDs.  ?Pt also c/o sore throat esp with swallowing for a couple says.  ?

## 2022-03-09 ENCOUNTER — Telehealth (HOSPITAL_COMMUNITY): Payer: Self-pay | Admitting: Emergency Medicine

## 2022-03-09 LAB — CYTOLOGY, (ORAL, ANAL, URETHRAL) ANCILLARY ONLY
Chlamydia: POSITIVE — AB
Comment: NEGATIVE
Comment: NEGATIVE
Comment: NORMAL
Neisseria Gonorrhea: NEGATIVE
Trichomonas: NEGATIVE

## 2022-03-09 MED ORDER — DOXYCYCLINE HYCLATE 100 MG PO CAPS: 100.0000 mg | ORAL_CAPSULE | Freq: Two times a day (BID) | ORAL | 0 refills | Status: AC

## 2022-03-11 LAB — CULTURE, GROUP A STREP (THRC)

## 2022-03-18 ENCOUNTER — Emergency Department (HOSPITAL_COMMUNITY)
Admission: EM | Admit: 2022-03-18 | Discharge: 2022-03-18 | Disposition: A | Discharge status: Home / Self Care | Payer: Medicaid Other | Attending: Emergency Medicine | Admitting: Emergency Medicine

## 2022-03-18 ENCOUNTER — Encounter (HOSPITAL_COMMUNITY): Payer: Self-pay | Admitting: Emergency Medicine

## 2022-03-18 ENCOUNTER — Other Ambulatory Visit: Payer: Self-pay

## 2022-03-18 ENCOUNTER — Emergency Department (HOSPITAL_COMMUNITY): Payer: Medicaid Other

## 2022-03-18 DIAGNOSIS — J392 Other diseases of pharynx: Secondary | ICD-10-CM | POA: Diagnosis not present

## 2022-03-18 DIAGNOSIS — R1310 Dysphagia, unspecified: Secondary | ICD-10-CM | POA: Diagnosis not present

## 2022-03-18 LAB — I-STAT CHEM 8, ED
BUN: 15 mg/dL (ref 6–20)
Calcium, Ion: 1 mmol/L — ABNORMAL LOW (ref 1.15–1.40)
Chloride: 107 mmol/L (ref 98–111)
Creatinine, Ser: 0.8 mg/dL (ref 0.61–1.24)
Glucose, Bld: 71 mg/dL (ref 70–99)
HCT: 42 % (ref 39.0–52.0)
Hemoglobin: 14.3 g/dL (ref 13.0–17.0)
Potassium: 5.8 mmol/L — ABNORMAL HIGH (ref 3.5–5.1)
Sodium: 140 mmol/L (ref 135–145)
TCO2: 28 mmol/L (ref 22–32)

## 2022-03-18 LAB — GROUP A STREP BY PCR: Group A Strep by PCR: NOT DETECTED

## 2022-03-18 MED ORDER — DEXAMETHASONE SODIUM PHOSPHATE 10 MG/ML IJ SOLN
10.0000 mg | Freq: Once | INTRAMUSCULAR | Status: AC
  Administered 2022-03-18: 10 mg via INTRAVENOUS
  Filled 2022-03-18: qty 1

## 2022-03-18 MED ORDER — IOHEXOL 300 MG/ML  SOLN
100.0000 mL | Freq: Once | INTRAMUSCULAR | Status: AC | PRN
Start: 2022-03-18 — End: 2022-03-18
  Administered 2022-03-18: 100 mL via INTRAVENOUS

## 2022-03-18 MED ORDER — IBUPROFEN 400 MG PO TABS
600.0000 mg | ORAL_TABLET | Freq: Once | ORAL | Status: AC
  Administered 2022-03-18: 600 mg via ORAL
  Filled 2022-03-18: qty 1

## 2022-03-18 NOTE — ED Triage Notes (Signed)
Pt reports difficulty swallowing since this morning and swollen uvula.  Denies sore throat.  States he completed doxycycline Rx 2 days ago for chlamydia.   ?

## 2022-03-18 NOTE — ED Provider Notes (Signed)
?[facility] EMERGENCY DEPARTMENT ?Provider Note ? ? ?CSN: [id number] ?Arrival date & time: 03/18/22  0944 ? ?  ? ?History ? ?Chief Complaint  ?Patient presents with  ? Dysphagia  ? ? ?John Brown is a 21 y.o. male who presents to the emergency department complaining of difficulty swallowing onset this morning.  Denies sore throat.  Has not tried any medications for his symptoms.  Denies painful swallowing, fever, chills, chest pain, shortness of breath.  Denies sick contacts. Pt notes that he completed antibiotic (doxycycline) 2 days ago to treat chlamydia. ? ? ? ? ?The history is provided by the patient. No language interpreter was used.  ? ?  ? ?Home Medications ?Prior to Admission medications   ?Not on File  ?   ? ?Allergies    ?Patient has no known allergies.   ? ?Review of Systems   ?Review of Systems  ?Constitutional:  Negative for chills and fever.  ?HENT:  Positive for trouble swallowing. Negative for congestion, rhinorrhea and sore throat.   ?Respiratory:  Negative for shortness of breath.   ?Cardiovascular:  Negative for chest pain.  ?All other systems reviewed and are negative. ? ?Physical Exam ?Updated Vital Signs ?BP 131/82   Pulse 89   Temp 97.8 ?F (36.6 ?C) (Oral)   Resp 14   SpO2 96%  ?Physical Exam ?Vitals and nursing note reviewed.  ?Constitutional:   ?   General: He is not in acute distress. ?   Appearance: He is not diaphoretic.  ?HENT:  ?   Head: Normocephalic and atraumatic.  ?   Mouth/Throat:  ?   Mouth: Mucous membranes are moist.  ?   Pharynx: Oropharynx is clear. Uvula midline. Uvula swelling present. No pharyngeal swelling or oropharyngeal exudate.  ?   Tonsils: No tonsillar exudate or tonsillar abscesses.  ?Eyes:  ?   General: No scleral icterus. ?   Conjunctiva/sclera: Conjunctivae normal.  ?Cardiovascular:  ?   Rate and Rhythm: Normal rate and regular rhythm.  ?   Pulses: Normal pulses.  ?   Heart sounds: Normal heart sounds.  ?Pulmonary:  ?   Effort: Pulmonary  effort is normal. No respiratory distress.  ?   Breath sounds: Normal breath sounds. No wheezing.  ?Abdominal:  ?   General: Bowel sounds are normal.  ?   Palpations: Abdomen is soft. There is no mass.  ?   Tenderness: There is no abdominal tenderness. There is no guarding or rebound.  ?Musculoskeletal:     ?   General: Normal range of motion.  ?   Cervical back: Normal range of motion and neck supple.  ?Skin: ?   General: Skin is warm and dry.  ?Neurological:  ?   Mental Status: He is alert.  ?Psychiatric:     ?   Behavior: Behavior normal.  ? ? ?ED Results / Procedures / Treatments   ?Labs ?(all labs ordered are listed, but only abnormal results are displayed) ?Labs Reviewed  ?I-STAT CHEM 8, ED - Abnormal; Notable for the following components:  ?    Result Value  ? Potassium 5.8 (*)   ? Calcium, Ion 1.00 (*)   ? All other components within normal limits  ?GROUP A STREP BY PCR  ? ? ?EKG ?None ? ?Radiology ?CT Soft Tissue Neck W Contrast ? ?Result Date: 03/18/2022 ?CLINICAL DATA:  Difficulty swallowing this morning, swollen uvula EXAM: CT NECK WITH CONTRAST TECHNIQUE: Multidetector CT imaging of the neck was performed using the standard  protocol following the bolus administration of intravenous contrast. RADIATION DOSE REDUCTION: This exam was performed according to the departmental dose-optimization program which includes automated exposure control, adjustment of the mA and/or kV according to patient size and/or use of iterative reconstruction technique. CONTRAST:  OMNIPAQUE IOHEXOL 300 MG/ML  SOLN COMPARISON:  None. FINDINGS: Pharynx and larynx: The nasal cavity and nasopharynx are unremarkable. The palatine tonsils are normal in appearance, with no abnormal swelling or enhancement. The uvula is normal in appearance. There is no effacement of the oropharyngeal airway. The parapharyngeal spaces are clear. There is a 1.6 cm x 1.4 cm x 1.5 cm hypodense lesion in the midline vallecula measuring greater than  simple fluid attenuation. The lesion is well-circumscribed without evidence of invasion of the surrounding structures. There is periapical lucency surrounding the right maxillary central incisor. The hypopharynx and larynx are unremarkable. The vocal folds are normal in appearance. Salivary glands: The parotid and submandibular glands are unremarkable. Thyroid: Unremarkable. Lymph nodes: There is no pathologic lymphadenopathy in the neck. Vascular: The major vessels of the neck are unremarkable. Limited intracranial: The imaged portions of the intracranial compartment are unremarkable. Visualized orbits: The globes and orbits are unremarkable. Mastoids and visualized paranasal sinuses: There is mild mucosal thickening in the left maxillary sinus. The mastoid air cells are clear. Skeleton: Bones are unremarkable. Upper chest: The imaged lung apices are clear. Other: None. IMPRESSION: 1. No acute findings in the neck. No evidence of tonsillar or uvular swelling. No lymphadenopathy or abscess. 2. 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 Consider nonemergent outpatient ENT referral. 3. Periapical lucency surrounding the right maxillary central incisor. Electronically Signed   By: Lesia Hausen M.D.   On: 03/18/2022 13:54   ? ?Procedures ?Procedures  ? ? ?Medications Ordered in ED ?Medications  ?ibuprofen (ADVIL) tablet 600 mg (600 mg Oral Given 03/18/22 1307)  ?iohexol (OMNIPAQUE) 300 MG/ML solution 100 mL (100 mLs Intravenous Contrast Given 03/18/22 1332)  ?dexamethasone (DECADRON) injection 10 mg (10 mg Intravenous Given 03/18/22 1433)  ? ? ?ED Course/ Medical Decision Making/ A&P ?Clinical Course as of 03/18/22 1505  ?Sat Mar 18, 2022  ?1412 discussed [SB]  ?  ?Clinical Course User Index ?[SB] Bintou Lafata A, PA-C  ? ?                        ?Medical Decision Making ?Amount and/or Complexity of Data Reviewed ?Radiology: ordered. ? ?Risk ?Prescription  drug management. ? ? ?Pt presents with trouble swallowing onset this morning.  Denies sick contacts.  Vital signs stable, patient afebrile, not tachycardic or hypoxic.  On exam patient with uvula midline with mild swelling noted.  No posterior pharyngeal swelling, exudate.  No tonsillar exudate or abscess appreciated.  No acute cardiovascular or respiratory exam findings.  Differential diagnosis includes strep pharyngitis, viral pharyngitis, tonsillitis, viral etiology ? ?Labs:  ?I ordered, and personally interpreted labs.  The pertinent results include:   ?Group A strep negative. ?I-STAT Chem-8 overall unremarkable ? ?Imaging: ?I ordered imaging studies including CT soft tissue neck ?I independently visualized and interpreted imaging which showed:  ?1. No acute findings in the neck. No evidence of tonsillar or uvular  ?swelling. No lymphadenopathy or abscess.  ?2. 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 Consider nonemergent  ?outpatient ENT referral.  ?3. Periapical lucency surrounding the right maxillary central  ?incisor.  ? ?  I agree with the radiologist interpretation ? ?Medications:  ?I ordered medication including Decadron and ibuprofen for symptom management ?Reevaluation of the patient after these medicines and interventions, I reevaluated the patient and found that they have improved ?I have reviewed the patients home medicines and have made adjustments as needed ? ? ?Disposition: ?Patient presentation suspicious for viral etiology.  Doubt strep pharyngitis at this time.  Doubt tonsillitis, PTA, or Ludwigs Angina. After consideration of the diagnostic results and the patients response to treatment, I feel that the patient would benefit from Discharge home. Supportive care measures and strict return precautions discussed with patient at bedside. Pt acknowledges and verbalizes understanding. Pt appears safe for discharge. Follow up as  indicated in discharge paperwork.  ? ? ?This chart was dictated using voice recognition software, Dragon. Despite the best efforts of this provider to proofread and correct errors, errors may still occur which can c

## 2022-03-18 NOTE — Discharge Instructions (Addendum)
It was a pleasure taking care of you today!  ? ?Your CT scan didn't show an abscess today. Your labs were unremarkable. You may take over the counter 600 mg Ibuprofen every 6 hours as needed for pain for no more than 7 days. There was a finding of a cyst on your CT scan, you may follow up in the outpatient setting with the Ears, Nose, and Throat (ENT) specialist regarding further evaluation of this finding. Attached is the information for the on-call ENT specialist, call on Monday, 03/20/22 to set up a follow up appointment regarding todays ED visit. You may follow up with your primary care provider as needed. Return to the ED if you are experiencing increasing/worsening trouble swallowing, fever, drooling, or worsening symptoms.  ?

## 2022-04-13 IMAGING — CT CT NECK W/ CM
3 of 4 series · 13 of 33 positions shown, 16 images · IV contrast (Omni 300)
Comparison: None.

CLINICAL DATA: Difficulty swallowing this morning, swollen uvula

EXAM:
CT NECK WITH CONTRAST
TECHNIQUE: Multidetector CT imaging of the neck was performed using the
standard protocol following the bolus administration of intravenous
contrast.

[Series 3: neck 2.0 st · axial · 0.47mm/px · z∈[-266,-64]mm · 5 of 153 slices shown, 7 images]
[im 26/153  soft-tissue]
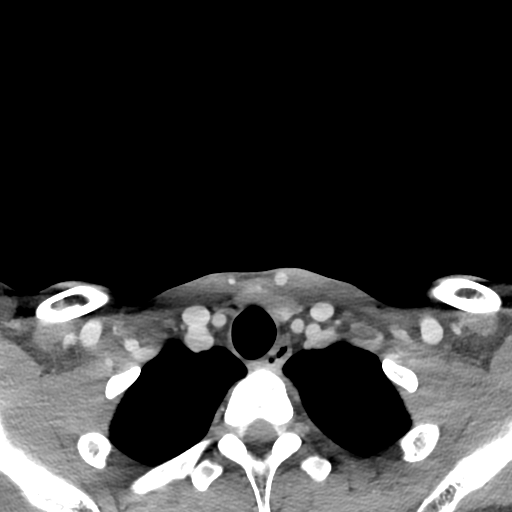
[im 26/153  bone]
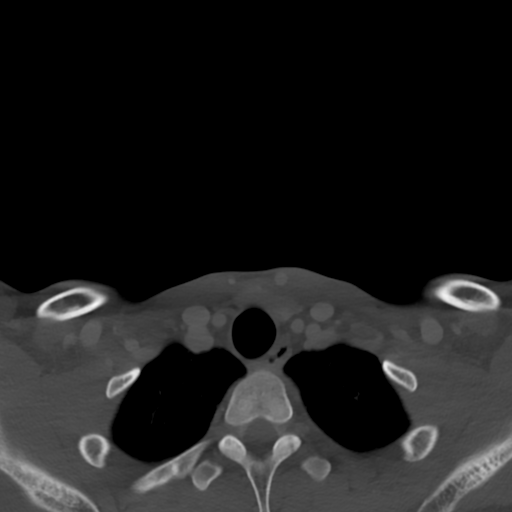
[im 51/153  bone]
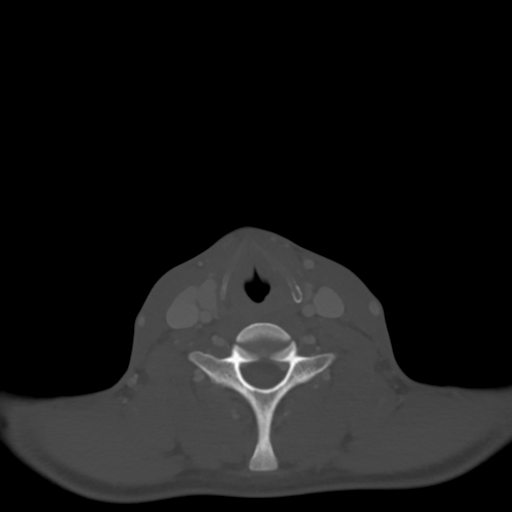
[im 77/153  bone]
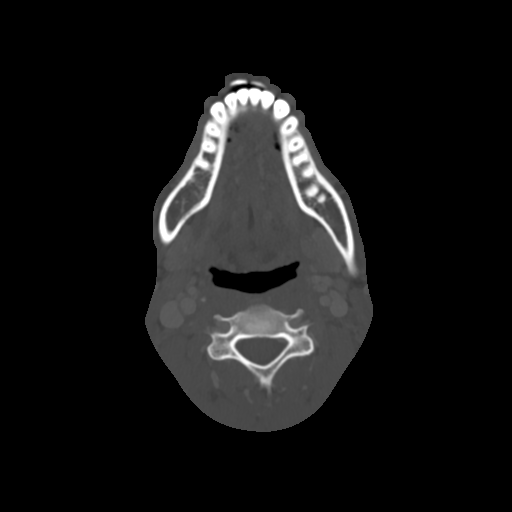
[im 102/153  bone]
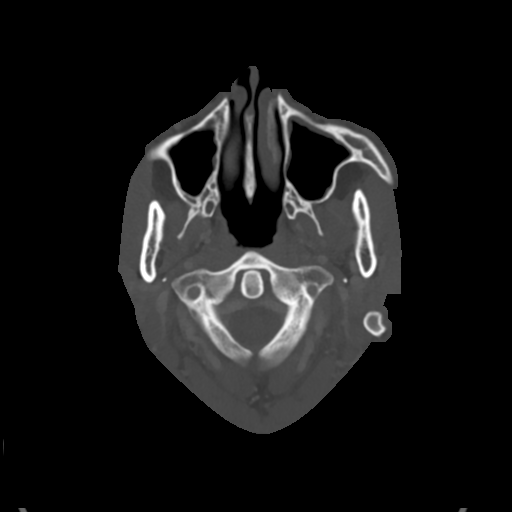
[im 127/153  soft-tissue]
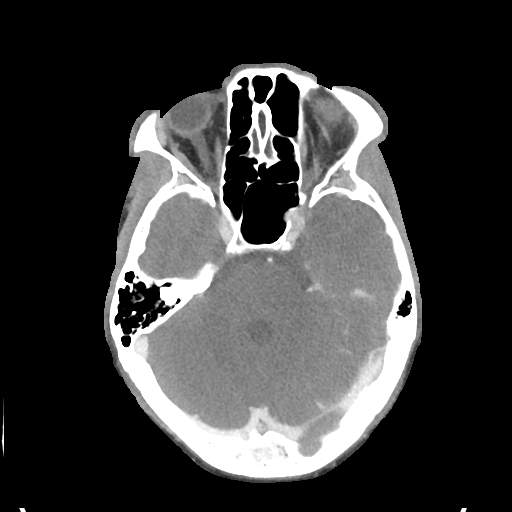
[im 127/153  bone]
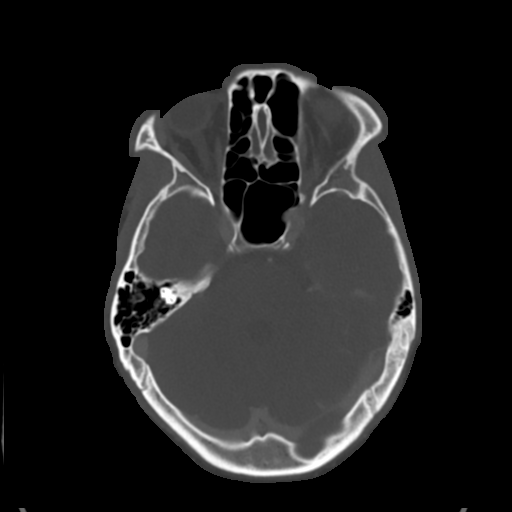

[Series 4: sagittal · sagittal · 0.52mm/px · 5 of 81 slices shown, 6 images]
[im 27/81  bone]
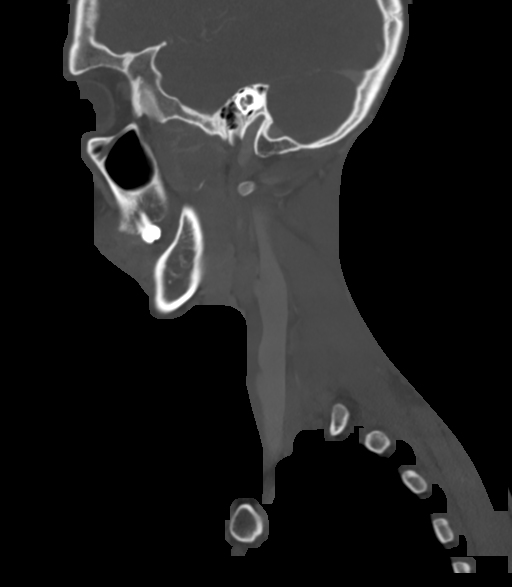
[im 34/81  bone]
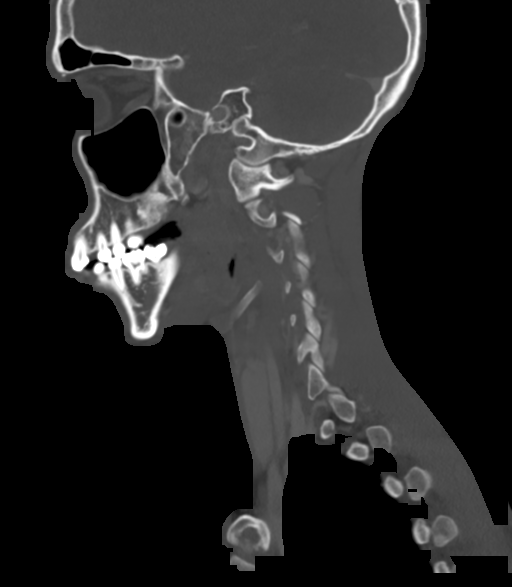
[im 41/81  soft-tissue]
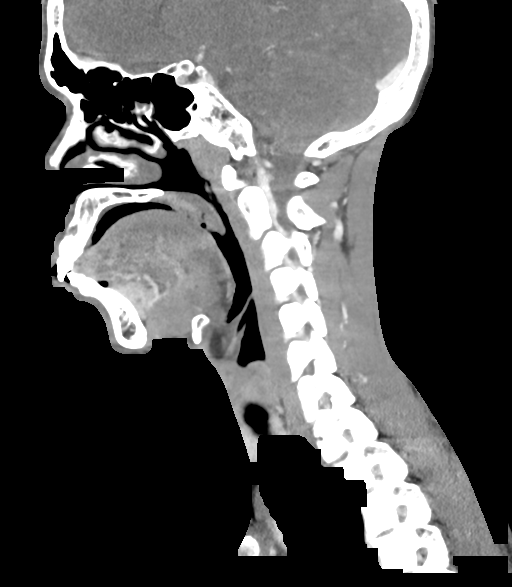
[im 41/81  bone]
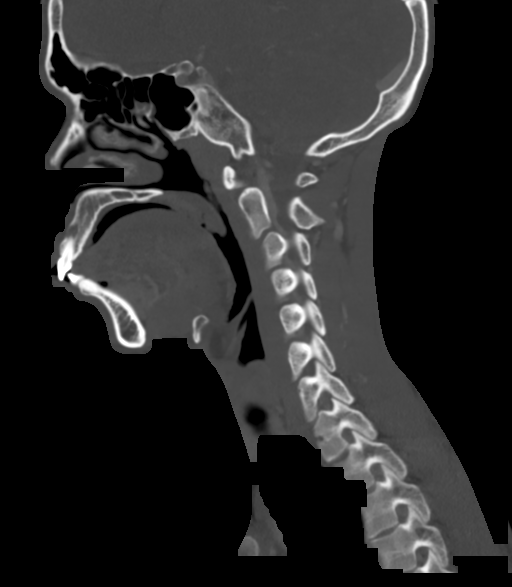
[im 47/81  bone]
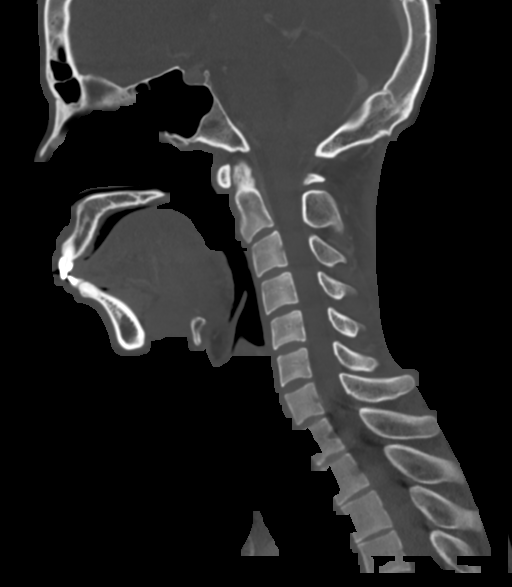
[im 54/81  bone]
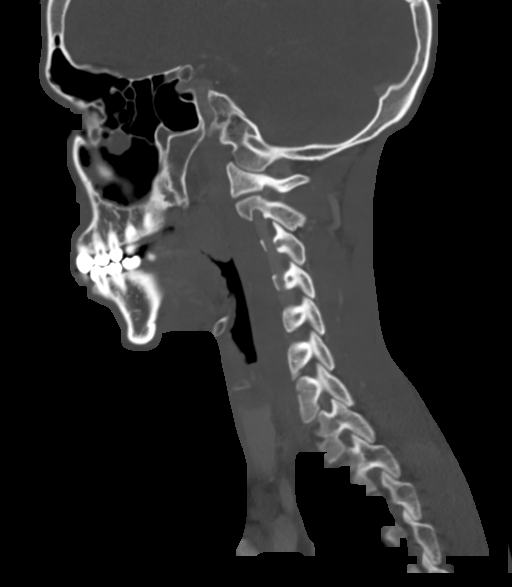

[Series 5: coronal · coronal · 0.35mm/px · 3 of 110 slices shown]
[im 22/110  bone]
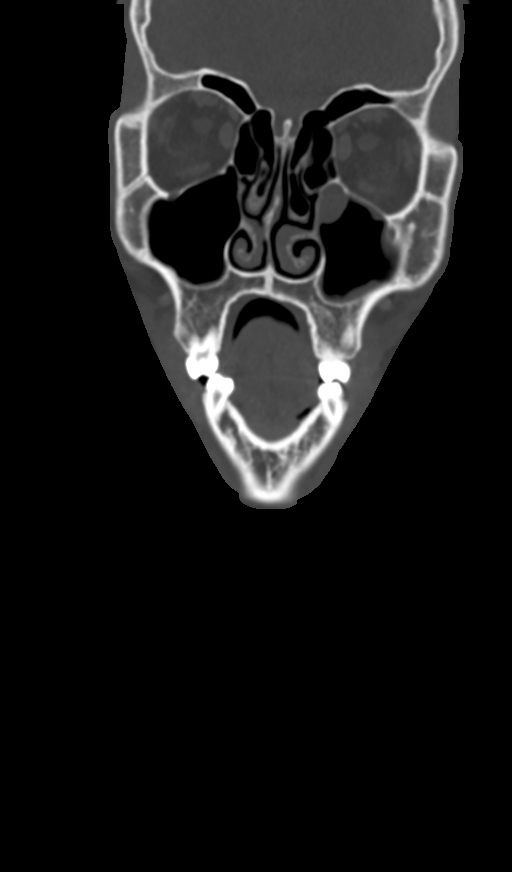
[im 44/110  bone]
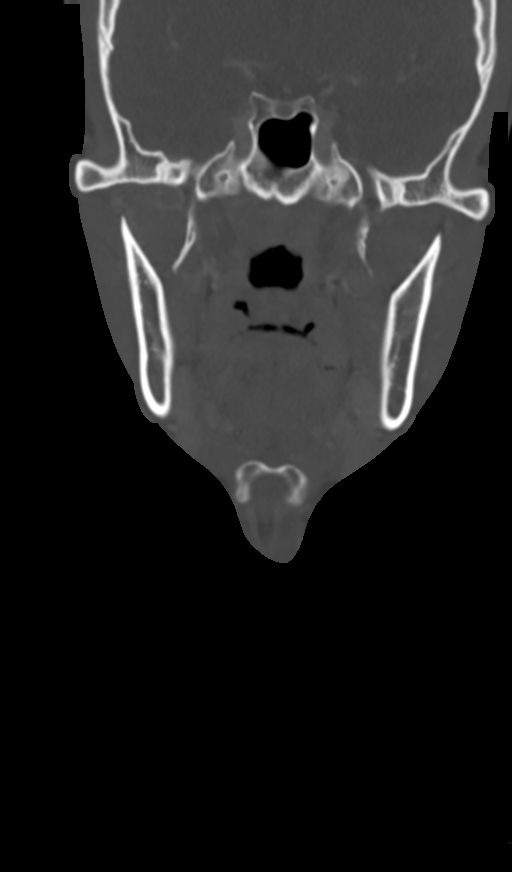
[im 66/110  bone]
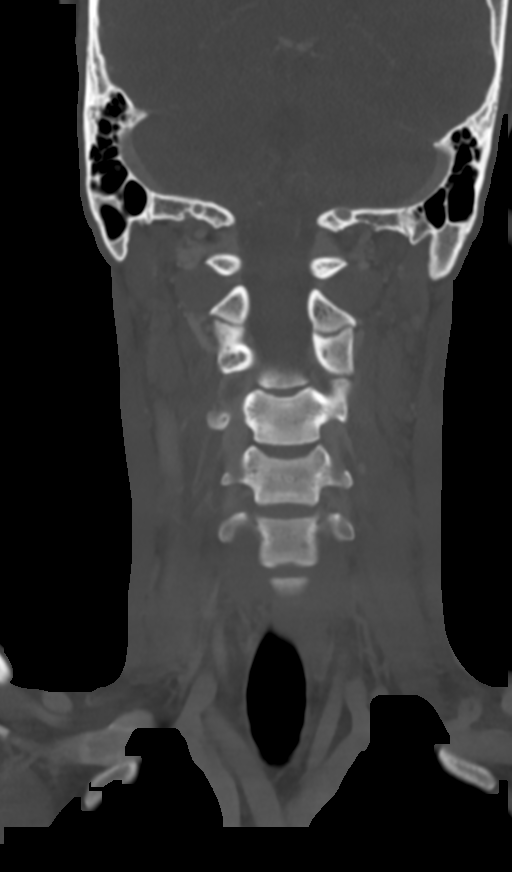

[13 of 33 positions shown; findings below may reference images not displayed]

RADIATION DOSE REDUCTION: This exam was performed according to the
departmental dose-optimization program which includes automated
exposure control, adjustment of the mA and/or kV according to
patient size and/or use of iterative reconstruction technique.

CONTRAST:  100mL OMNIPAQUE IOHEXOL 300 MG/ML  SOLN
FINDINGS: Pharynx and larynx: The nasal cavity and nasopharynx are
unremarkable.

The palatine tonsils are normal in appearance, with no abnormal
swelling or enhancement. The uvula is normal in appearance. There is
no effacement of the oropharyngeal airway. The parapharyngeal spaces
are clear.

There is a 1.6 cm x 1.4 cm x 1.5 cm hypodense lesion in the midline
vallecula measuring greater than simple fluid attenuation. The
lesion is well-circumscribed without evidence of invasion of the
surrounding structures.

There is periapical lucency surrounding the right maxillary central
incisor.

The hypopharynx and larynx are unremarkable. The vocal folds are
normal in appearance.

Salivary glands: The parotid and submandibular glands are
unremarkable.

Thyroid: Unremarkable.

Lymph nodes: There is no pathologic lymphadenopathy in the neck.

Vascular: The major vessels of the neck are unremarkable.

Limited intracranial: The imaged portions of the intracranial
compartment are unremarkable.

Visualized orbits: The globes and orbits are unremarkable.

Mastoids and visualized paranasal sinuses: There is mild mucosal
thickening in the left maxillary sinus. The mastoid air cells are
clear.

Skeleton: Bones are unremarkable.

Upper chest: The imaged lung apices are clear.

Other: None.
IMPRESSION: 1. No acute findings in the neck. No evidence of tonsillar or uvular
swelling. No lymphadenopathy or abscess.
2. 1.6 cm hypodense well-circumscribed structure in the vallecula
most likely reflects a thyroglossal duct cyst. Consider nonemergent
outpatient ENT referral.
3. Periapical lucency surrounding the right maxillary central
incisor.

## 2022-05-30 ENCOUNTER — Encounter: Payer: Self-pay | Admitting: *Deleted

## 2023-03-26 DIAGNOSIS — R600 Localized edema: Secondary | ICD-10-CM | POA: Diagnosis not present

## 2023-03-26 DIAGNOSIS — R221 Localized swelling, mass and lump, neck: Secondary | ICD-10-CM | POA: Diagnosis not present

## 2023-03-26 DIAGNOSIS — J029 Acute pharyngitis, unspecified: Secondary | ICD-10-CM | POA: Diagnosis not present

## 2023-04-25 DIAGNOSIS — R519 Headache, unspecified: Secondary | ICD-10-CM | POA: Diagnosis not present

## 2023-04-25 DIAGNOSIS — R0781 Pleurodynia: Secondary | ICD-10-CM | POA: Diagnosis not present

## 2023-05-01 DIAGNOSIS — H109 Unspecified conjunctivitis: Secondary | ICD-10-CM | POA: Diagnosis not present

## 2023-05-04 DIAGNOSIS — H1012 Acute atopic conjunctivitis, left eye: Secondary | ICD-10-CM | POA: Diagnosis not present

## 2023-05-04 DIAGNOSIS — H11422 Conjunctival edema, left eye: Secondary | ICD-10-CM | POA: Diagnosis not present

## 2023-10-13 ENCOUNTER — Observation Stay (HOSPITAL_COMMUNITY)
Admission: EM | Admit: 2023-10-13 | Discharge: 2023-10-14 | Disposition: A | Discharge status: Home / Self Care | Payer: Medicaid Other | Attending: General Surgery | Admitting: General Surgery

## 2023-10-13 ENCOUNTER — Emergency Department (HOSPITAL_COMMUNITY): Payer: Medicaid Other

## 2023-10-13 ENCOUNTER — Encounter (HOSPITAL_COMMUNITY): Payer: Self-pay

## 2023-10-13 DIAGNOSIS — W3400XA Accidental discharge from unspecified firearms or gun, initial encounter: Principal | ICD-10-CM

## 2023-10-13 DIAGNOSIS — S21119A Laceration without foreign body of unspecified front wall of thorax without penetration into thoracic cavity, initial encounter: Principal | ICD-10-CM

## 2023-10-13 DIAGNOSIS — S27399A Other injuries of lung, unspecified, initial encounter: Principal | ICD-10-CM | POA: Insufficient documentation

## 2023-10-13 DIAGNOSIS — Z79899 Other long term (current) drug therapy: Secondary | ICD-10-CM | POA: Insufficient documentation

## 2023-10-13 DIAGNOSIS — S21131A Puncture wound without foreign body of right front wall of thorax without penetration into thoracic cavity, initial encounter: Secondary | ICD-10-CM | POA: Diagnosis not present

## 2023-10-13 DIAGNOSIS — Z23 Encounter for immunization: Secondary | ICD-10-CM | POA: Diagnosis not present

## 2023-10-13 DIAGNOSIS — S27322A Contusion of lung, bilateral, initial encounter: Secondary | ICD-10-CM | POA: Diagnosis not present

## 2023-10-13 DIAGNOSIS — Y249XXA Unspecified firearm discharge, undetermined intent, initial encounter: Secondary | ICD-10-CM

## 2023-10-13 DIAGNOSIS — S21111A Laceration without foreign body of right front wall of thorax without penetration into thoracic cavity, initial encounter: Secondary | ICD-10-CM | POA: Diagnosis not present

## 2023-10-13 LAB — COMPREHENSIVE METABOLIC PANEL
ALT: 24 U/L (ref 0–44)
AST: 24 U/L (ref 15–41)
Albumin: 4.3 g/dL (ref 3.5–5.0)
Alkaline Phosphatase: 65 U/L (ref 38–126)
Anion gap: 15 (ref 5–15)
BUN: 8 mg/dL (ref 6–20)
CO2: 23 mmol/L (ref 22–32)
Calcium: 9.7 mg/dL (ref 8.9–10.3)
Chloride: 104 mmol/L (ref 98–111)
Creatinine, Ser: 1.03 mg/dL (ref 0.61–1.24)
GFR, Estimated: 48 mL/min — ABNORMAL LOW (ref >60)
Glucose, Bld: 135 mg/dL — ABNORMAL HIGH (ref 70–99)
Potassium: 2.9 mmol/L — ABNORMAL LOW (ref 3.5–5.1)
Sodium: 142 mmol/L (ref 135–145)
Total Bilirubin: 0.4 mg/dL (ref 0.3–1.2)
Total Protein: 7.6 g/dL (ref 6.5–8.1)

## 2023-10-13 LAB — CBC WITH DIFFERENTIAL/PLATELET
Abs Immature Granulocytes: 0.04 10*3/uL (ref 0.00–0.07)
Basophils Absolute: 0.1 10*3/uL (ref 0.0–0.1)
Basophils Relative: 1 %
Eosinophils Absolute: 0.4 10*3/uL (ref 0.0–0.5)
Eosinophils Relative: 3 %
HCT: 39.1 % (ref 39.0–52.0)
Hemoglobin: 13.5 g/dL (ref 13.0–17.0)
Immature Granulocytes: 0 %
Lymphocytes Relative: 40 %
Lymphs Abs: 5.2 10*3/uL — ABNORMAL HIGH (ref 0.7–4.0)
MCH: 27.9 pg (ref 26.0–34.0)
MCHC: 34.5 g/dL (ref 30.0–36.0)
MCV: 80.8 fL (ref 80.0–100.0)
Monocytes Absolute: 0.7 10*3/uL (ref 0.1–1.0)
Monocytes Relative: 5 %
Neutro Abs: 6.5 10*3/uL (ref 1.7–7.7)
Neutrophils Relative %: 51 %
Platelets: 316 10*3/uL (ref 150–400)
RBC: 4.84 MIL/uL (ref 4.22–5.81)
RDW: 12.3 % (ref 11.5–15.5)
WBC: 12.8 10*3/uL — ABNORMAL HIGH (ref 4.0–10.5)
nRBC: 0 % (ref 0.0–0.2)

## 2023-10-13 LAB — I-STAT CHEM 8, ED
BUN: 8 mg/dL (ref 6–20)
Calcium, Ion: 1.11 mmol/L — ABNORMAL LOW (ref 1.15–1.40)
Chloride: 104 mmol/L (ref 98–111)
Creatinine, Ser: 1.3 mg/dL — ABNORMAL HIGH (ref 0.61–1.24)
Glucose, Bld: 131 mg/dL — ABNORMAL HIGH (ref 70–99)
HCT: 42 % (ref 39.0–52.0)
Hemoglobin: 14.3 g/dL (ref 13.0–17.0)
Potassium: 2.9 mmol/L — ABNORMAL LOW (ref 3.5–5.1)
Sodium: 143 mmol/L (ref 135–145)
TCO2: 22 mmol/L (ref 22–32)

## 2023-10-13 LAB — SAMPLE TO BLOOD BANK

## 2023-10-13 LAB — ETHANOL: Alcohol, Ethyl (B): 135 mg/dL — ABNORMAL HIGH (ref <10)

## 2023-10-13 LAB — PROTIME-INR
INR: 1.1 (ref 0.8–1.2)
Prothrombin Time: 13.9 s (ref 11.4–15.2)

## 2023-10-13 LAB — I-STAT CG4 LACTIC ACID, ED: Lactic Acid, Venous: 4.9 mmol/L (ref 0.5–1.9)

## 2023-10-13 MED ORDER — FENTANYL CITRATE PF 50 MCG/ML IJ SOSY
50.0000 ug | PREFILLED_SYRINGE | Freq: Once | INTRAMUSCULAR | Status: AC
Start: 2023-10-13 — End: 2023-10-13
  Administered 2023-10-13: 50 ug via INTRAVENOUS
  Filled 2023-10-13: qty 1

## 2023-10-13 MED ORDER — FENTANYL CITRATE PF 50 MCG/ML IJ SOSY
PREFILLED_SYRINGE | INTRAMUSCULAR | Status: AC
  Filled 2023-10-13: qty 2

## 2023-10-13 MED ORDER — FENTANYL CITRATE PF 50 MCG/ML IJ SOSY
50.0000 ug | PREFILLED_SYRINGE | Freq: Once | INTRAMUSCULAR | Status: AC
  Administered 2023-10-13: 50 ug via INTRAVENOUS
  Filled 2023-10-13: qty 1

## 2023-10-13 MED ORDER — TETANUS-DIPHTH-ACELL PERTUSSIS 5-2.5-18.5 LF-MCG/0.5 IM SUSY: 0.5000 mL | PREFILLED_SYRINGE | Freq: Once | INTRAMUSCULAR | Status: DC

## 2023-10-13 MED ORDER — LIDOCAINE-EPINEPHRINE (PF) 2 %-1:200000 IJ SOLN
20.0000 mL | Freq: Once | INTRAMUSCULAR | Status: AC
  Administered 2023-10-13: 20 mL via INTRADERMAL
  Filled 2023-10-13: qty 20

## 2023-10-13 MED ORDER — IOHEXOL 350 MG/ML SOLN
50.0000 mL | Freq: Once | INTRAVENOUS | Status: AC | PRN
  Administered 2023-10-13: 50 mL via INTRAVENOUS

## 2023-10-13 MED ORDER — TETANUS-DIPHTH-ACELL PERTUSSIS 5-2.5-18.5 LF-MCG/0.5 IM SUSY
0.5000 mL | PREFILLED_SYRINGE | Freq: Once | INTRAMUSCULAR | Status: AC
  Administered 2023-10-13: 0.5 mL via INTRAMUSCULAR
  Filled 2023-10-13: qty 0.5
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
## 2023-10-13 NOTE — ED Notes (Signed)
Patient transported to CT 

## 2023-10-13 NOTE — ED Notes (Signed)
WATCH AND EARRINGS REMOVED FOR CT AND PLACED IN LABS/BIO BAG. GIVEN BACK TO PT WHEN RETURNED TO ROOM

## 2023-10-14 DIAGNOSIS — S21131A Puncture wound without foreign body of right front wall of thorax without penetration into thoracic cavity, initial encounter: Secondary | ICD-10-CM | POA: Diagnosis not present

## 2023-10-14 DIAGNOSIS — S27322A Contusion of lung, bilateral, initial encounter: Secondary | ICD-10-CM | POA: Diagnosis not present

## 2023-10-14 LAB — BASIC METABOLIC PANEL
Anion gap: 11 (ref 5–15)
BUN: 6 mg/dL (ref 6–20)
CO2: 27 mmol/L (ref 22–32)
Calcium: 9.4 mg/dL (ref 8.9–10.3)
Chloride: 101 mmol/L (ref 98–111)
Creatinine, Ser: 0.84 mg/dL (ref 0.61–1.24)
GFR, Estimated: >60 mL/min (ref >60)
Glucose, Bld: 102 mg/dL — ABNORMAL HIGH (ref 70–99)
Potassium: 3.4 mmol/L — ABNORMAL LOW (ref 3.5–5.1)
Sodium: 139 mmol/L (ref 135–145)

## 2023-10-14 LAB — CBC
HCT: 36.7 % — ABNORMAL LOW (ref 39.0–52.0)
Hemoglobin: 12.7 g/dL — ABNORMAL LOW (ref 13.0–17.0)
MCH: 28 pg (ref 26.0–34.0)
MCHC: 34.6 g/dL (ref 30.0–36.0)
MCV: 80.8 fL (ref 80.0–100.0)
Platelets: 259 10*3/uL (ref 150–400)
RBC: 4.54 MIL/uL (ref 4.22–5.81)
RDW: 12.4 % (ref 11.5–15.5)
WBC: 14.4 10*3/uL — ABNORMAL HIGH (ref 4.0–10.5)
nRBC: 0 % (ref 0.0–0.2)

## 2023-10-14 LAB — HIV ANTIBODY (ROUTINE TESTING W REFLEX): HIV Screen 4th Generation wRfx: NONREACTIVE

## 2023-10-14 MED ORDER — ONDANSETRON HCL 4 MG/2ML IJ SOLN
4.0000 mg | Freq: Four times a day (QID) | INTRAMUSCULAR | Status: DC | PRN
  Administered 2023-10-14: 4 mg via INTRAVENOUS
  Filled 2023-10-14: qty 2

## 2023-10-14 MED ORDER — KCL IN DEXTROSE-NACL 20-5-0.45 MEQ/L-%-% IV SOLN
INTRAVENOUS | Status: DC
  Filled 2023-10-14: qty 1000

## 2023-10-14 MED ORDER — BACITRACIN ZINC 500 UNIT/GM EX OINT
TOPICAL_OINTMENT | Freq: Two times a day (BID) | CUTANEOUS | Status: DC
  Administered 2023-10-14: 1 via TOPICAL
  Filled 2023-10-14: qty 0.9

## 2023-10-14 MED ORDER — OXYCODONE HCL 5 MG PO TABS
5.0000 mg | ORAL_TABLET | ORAL | Status: DC | PRN
  Administered 2023-10-14: 5 mg via ORAL
  Filled 2023-10-14: qty 1

## 2023-10-14 MED ORDER — ACETAMINOPHEN 500 MG PO TABS: 1000.0000 mg | ORAL_TABLET | Freq: Four times a day (QID) | ORAL | Status: AC | PRN

## 2023-10-14 MED ORDER — HYDRALAZINE HCL 20 MG/ML IJ SOLN: 10.0000 mg | INTRAMUSCULAR | Status: DC | PRN

## 2023-10-14 MED ORDER — POLYETHYLENE GLYCOL 3350 17 G PO PACK: 17.0000 g | PACK | Freq: Every day | ORAL | Status: DC | PRN

## 2023-10-14 MED ORDER — ACETAMINOPHEN 500 MG PO TABS
1000.0000 mg | ORAL_TABLET | Freq: Four times a day (QID) | ORAL | Status: DC
  Administered 2023-10-14 (×2): 1000 mg via ORAL
  Filled 2023-10-14 (×2): qty 2

## 2023-10-14 MED ORDER — ONDANSETRON 4 MG PO TBDP
4.0000 mg | ORAL_TABLET | Freq: Four times a day (QID) | ORAL | Status: DC | PRN
  Administered 2023-10-14: 4 mg via ORAL
  Filled 2023-10-14: qty 1

## 2023-10-14 MED ORDER — BACITRACIN ZINC 500 UNIT/GM EX OINT: TOPICAL_OINTMENT | Freq: Two times a day (BID) | CUTANEOUS | Status: AC

## 2023-10-14 MED ORDER — OXYCODONE HCL 5 MG PO TABS
10.0000 mg | ORAL_TABLET | ORAL | Status: DC | PRN
  Administered 2023-10-14: 10 mg via ORAL
  Filled 2023-10-14: qty 2

## 2023-10-14 MED ORDER — METHOCARBAMOL 500 MG PO TABS
500.0000 mg | ORAL_TABLET | Freq: Three times a day (TID) | ORAL | Status: DC
  Administered 2023-10-14 (×2): 500 mg via ORAL
  Filled 2023-10-14 (×2): qty 1

## 2023-10-14 MED ORDER — METHOCARBAMOL 500 MG PO TABS: 500.0000 mg | ORAL_TABLET | Freq: Three times a day (TID) | ORAL | 0 refills | Status: AC

## 2023-10-14 MED ORDER — ENOXAPARIN SODIUM 30 MG/0.3ML IJ SOSY: 30.0000 mg | PREFILLED_SYRINGE | Freq: Two times a day (BID) | INTRAMUSCULAR | Status: DC

## 2023-10-14 MED ORDER — METHOCARBAMOL 1000 MG/10ML IJ SOLN: 500.0000 mg | Freq: Three times a day (TID) | INTRAMUSCULAR | Status: DC

## 2023-10-14 MED ORDER — DOCUSATE SODIUM 100 MG PO CAPS: 100.0000 mg | ORAL_CAPSULE | Freq: Two times a day (BID) | ORAL | Status: DC

## 2023-10-14 MED ORDER — HYDROMORPHONE HCL 1 MG/ML IJ SOLN
0.5000 mg | INTRAMUSCULAR | Status: DC | PRN
  Administered 2023-10-14: 0.5 mg via INTRAVENOUS
  Filled 2023-10-14: qty 1

## 2023-10-14 MED ORDER — OXYCODONE HCL 5 MG PO TABS: 5.0000 mg | ORAL_TABLET | Freq: Four times a day (QID) | ORAL | 0 refills | Status: AC | PRN
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
## 2023-10-14 NOTE — ED Provider Notes (Signed)
  ..  Laceration Repair  Date/Time: 10/14/2023 6:01 AM  Performed by: Nira Conn, MD Authorized by: Nira Conn, MD   Consent:    Consent obtained:  Verbal   Consent given by:  Patient   Risks discussed:  Infection, pain, nerve damage and poor wound healing   Alternatives discussed:  Delayed treatment Universal protocol:    Imaging studies available: yes   Laceration details:    Location:  Trunk   Trunk location:  L chest   Length (cm):  8 Pre-procedure details:    Preparation:  Patient was prepped and draped in usual sterile fashion and imaging obtained to evaluate for foreign bodies Exploration:    Hemostasis achieved with:  Direct pressure   Imaging obtained: x-ray     Imaging outcome: foreign body not noted     Wound exploration: wound explored through full range of motion and entire depth of wound visualized     Wound extent: fascia violated and muscle damage     Contaminated: yes   Treatment:    Area cleansed with:  Povidone-iodine and saline   Amount of cleaning:  Extensive   Irrigation solution:  Sterile saline   Irrigation volume:  2000   Irrigation method:  Pressure wash   Debridement:  None   Layers/structures repaired:  Muscle belly, muscle fascia, deep subcutaneous and deep dermal/superficial fascia Muscle belly:    Suture size:  3-0   Suture material:  Vicryl   Suture technique:  Buried horizontal mattress   Number of sutures:  2 Muscle fascia:    Suture size:  3-0   Suture material:  Vicryl   Suture technique:  Buried horizontal mattress   Number of sutures:  2 Deep subcutaneous:    Suture size:  3-0   Suture material:  Vicryl   Suture technique:  Buried horizontal mattress   Number of sutures:  5 Deep dermal/superficial fascia:    Suture size:  3-0   Suture material:  Vicryl   Suture technique:  Running   Number of sutures:  8 Skin repair:    Repair method:  Staples   Number of staples:  7 Approximation:     Approximation:  Close Repair type:    Repair type:  Complex Post-procedure details:    Dressing:  Non-adherent dressing   Procedure completion:  Tolerated .Marland KitchenLaceration Repair  Date/Time: 10/14/2023 6:04 AM  Performed by: Nira Conn, MD Authorized by: Nira Conn, MD   Laceration details:    Location:  Trunk   Trunk location:  R chest   Length (cm):  1.2 Treatment:    Area cleansed with:  Saline   Amount of cleaning:  Standard   Irrigation solution:  Sterile saline Skin repair:    Repair method:  Staples   Number of staples:  1       Artina Minella, Amadeo Garnet, MD 10/14/23 [phone]
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
## 2023-10-14 NOTE — ED Notes (Signed)
Pt states his throat kind of hurts and feels swollen. Pt still speaking clearly, maintaining secretions, non tender, and no obvious swelling noted. MD made aware and acknowledged.
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
## 2023-10-15 ENCOUNTER — Encounter (HOSPITAL_COMMUNITY): Payer: Self-pay | Admitting: Emergency Medicine

## 2023-10-15 ENCOUNTER — Telehealth: Payer: Self-pay | Admitting: Surgery

## 2023-10-15 NOTE — Telephone Encounter (Signed)
Received call stating patient needs a work letter, reviewed chart and AVS follow up arranged for 11/4. Work note done, patient and mom states they are unable to obtain view the letter in [employer]. Advise patient and mon John Brown to come to ED to pick up letter. Will inform daytime CM to follow-up

## 2023-10-19 ENCOUNTER — Encounter (HOSPITAL_COMMUNITY): Payer: Self-pay | Admitting: Emergency Medicine
